# Patient Record
Sex: Female | Born: 1945 | Race: White | Hispanic: No | Marital: Married | Smoking: Never smoker
Health system: Southern US, Community
[De-identification: ages and names within clinical notes are randomized; demographics above are authoritative.]

## PROBLEM LIST (undated history)

## (undated) DIAGNOSIS — K5792 Diverticulitis of intestine, part unspecified, without perforation or abscess without bleeding: Secondary | ICD-10-CM

## (undated) DIAGNOSIS — K219 Gastro-esophageal reflux disease without esophagitis: Secondary | ICD-10-CM

## (undated) HISTORY — PX: APPENDECTOMY: SHX54

## (undated) HISTORY — PX: TONSILLECTOMY: SUR1361

---

## 1975-02-11 HISTORY — PX: TUBAL LIGATION: SHX77

## 2013-06-16 DIAGNOSIS — K219 Gastro-esophageal reflux disease without esophagitis: Secondary | ICD-10-CM | POA: Insufficient documentation

## 2013-06-16 DIAGNOSIS — K5792 Diverticulitis of intestine, part unspecified, without perforation or abscess without bleeding: Secondary | ICD-10-CM | POA: Insufficient documentation

## 2015-01-13 ENCOUNTER — Encounter: Payer: Self-pay | Admitting: Emergency Medicine

## 2015-01-13 ENCOUNTER — Emergency Department: Payer: BC Managed Care – PPO

## 2015-01-13 DIAGNOSIS — R0602 Shortness of breath: Secondary | ICD-10-CM | POA: Insufficient documentation

## 2015-01-13 DIAGNOSIS — K219 Gastro-esophageal reflux disease without esophagitis: Secondary | ICD-10-CM | POA: Insufficient documentation

## 2015-01-13 DIAGNOSIS — Z7982 Long term (current) use of aspirin: Secondary | ICD-10-CM | POA: Insufficient documentation

## 2015-01-13 DIAGNOSIS — K8000 Calculus of gallbladder with acute cholecystitis without obstruction: Secondary | ICD-10-CM | POA: Diagnosis present

## 2015-01-13 DIAGNOSIS — R1013 Epigastric pain: Secondary | ICD-10-CM | POA: Diagnosis present

## 2015-01-13 DIAGNOSIS — Z9851 Tubal ligation status: Secondary | ICD-10-CM | POA: Diagnosis not present

## 2015-01-13 DIAGNOSIS — Z79899 Other long term (current) drug therapy: Secondary | ICD-10-CM | POA: Insufficient documentation

## 2015-01-13 DIAGNOSIS — R072 Precordial pain: Secondary | ICD-10-CM | POA: Insufficient documentation

## 2015-01-13 DIAGNOSIS — K8010 Calculus of gallbladder with chronic cholecystitis without obstruction: Principal | ICD-10-CM | POA: Insufficient documentation

## 2015-01-13 DIAGNOSIS — D72829 Elevated white blood cell count, unspecified: Secondary | ICD-10-CM | POA: Insufficient documentation

## 2015-01-13 DIAGNOSIS — R52 Pain, unspecified: Secondary | ICD-10-CM | POA: Diagnosis present

## 2015-01-13 DIAGNOSIS — K449 Diaphragmatic hernia without obstruction or gangrene: Secondary | ICD-10-CM | POA: Insufficient documentation

## 2015-01-13 LAB — CBC WITH DIFFERENTIAL/PLATELET
BASOS PCT: 1 %
Basophils Absolute: 0.1 10*3/uL (ref 0–0.1)
EOS ABS: 0.3 10*3/uL (ref 0–0.7)
EOS PCT: 3 %
HCT: 38.2 % (ref 35.0–47.0)
Hemoglobin: 12.3 g/dL (ref 12.0–16.0)
LYMPHS ABS: 2.3 10*3/uL (ref 1.0–3.6)
Lymphocytes Relative: 20 %
MCH: 26.4 pg (ref 26.0–34.0)
MCHC: 32.3 g/dL (ref 32.0–36.0)
MCV: 81.9 fL (ref 80.0–100.0)
MONO ABS: 0.6 10*3/uL (ref 0.2–0.9)
MONOS PCT: 5 %
Neutro Abs: 8.5 10*3/uL — ABNORMAL HIGH (ref 1.4–6.5)
Neutrophils Relative %: 71 %
Platelets: 281 10*3/uL (ref 150–440)
RBC: 4.67 MIL/uL (ref 3.80–5.20)
RDW: 14 % (ref 11.5–14.5)
WBC: 11.9 10*3/uL — ABNORMAL HIGH (ref 3.6–11.0)

## 2015-01-13 LAB — COMPREHENSIVE METABOLIC PANEL
ALBUMIN: 4.2 g/dL (ref 3.5–5.0)
ALT: 14 U/L (ref 14–54)
ANION GAP: 8 (ref 5–15)
AST: 19 U/L (ref 15–41)
Alkaline Phosphatase: 97 U/L (ref 38–126)
BILIRUBIN TOTAL: 0.4 mg/dL (ref 0.3–1.2)
BUN: 17 mg/dL (ref 6–20)
CALCIUM: 9.6 mg/dL (ref 8.9–10.3)
CO2: 23 mmol/L (ref 22–32)
CREATININE: 0.86 mg/dL (ref 0.44–1.00)
Chloride: 108 mmol/L (ref 101–111)
GFR calc non Af Amer: >60 mL/min (ref >60)
GLUCOSE: 165 mg/dL — AB (ref 65–99)
Potassium: 3.7 mmol/L (ref 3.5–5.1)
SODIUM: 139 mmol/L (ref 135–145)
TOTAL PROTEIN: 7.9 g/dL (ref 6.5–8.1)

## 2015-01-13 LAB — LIPASE, BLOOD: Lipase: 25 U/L (ref 11–51)

## 2015-01-13 MED ORDER — GI COCKTAIL ~~LOC~~
30.0000 mL | Freq: Once | ORAL | Status: AC
  Administered 2015-01-13: 30 mL via ORAL
  Filled 2015-01-13: qty 30

## 2015-01-13 MED ORDER — SODIUM CHLORIDE 0.9 % IV SOLN
1000.0000 mL | Freq: Once | INTRAVENOUS | Status: AC
  Administered 2015-01-13: 1000 mL via INTRAVENOUS

## 2015-01-13 MED ORDER — MORPHINE SULFATE (PF) 4 MG/ML IV SOLN
4.0000 mg | Freq: Once | INTRAVENOUS | Status: AC
  Administered 2015-01-13: 4 mg via INTRAVENOUS
  Filled 2015-01-13: qty 1

## 2015-01-13 MED ORDER — ONDANSETRON HCL 4 MG/2ML IJ SOLN
4.0000 mg | Freq: Once | INTRAMUSCULAR | Status: AC
Start: 2015-01-13 — End: 2015-01-13
  Administered 2015-01-13: 4 mg via INTRAVENOUS
  Filled 2015-01-13: qty 2

## 2015-01-13 NOTE — ED Notes (Signed)
Family member to stat registration stating pt only got minimal relief from GI cocktail and now she's nauseaous; IV to be placed with protocol orders for pain and nausea medication

## 2015-01-13 NOTE — ED Notes (Signed)
Pt c/o epigastric pain that radiates straight through to her back; denies nausea or vomiting; denies diaphoresis; denies shortness of breath; pt says she was getting ready for bed when the pain started; says she was at a family reunion today and ate a lot of different foods;

## 2015-01-14 ENCOUNTER — Emergency Department: Payer: BC Managed Care – PPO

## 2015-01-14 ENCOUNTER — Observation Stay
Admission: EM | Admit: 2015-01-14 | Discharge: 2015-01-16 | Disposition: A | Discharge status: Home / Self Care | Payer: BC Managed Care – PPO | Attending: Surgery | Admitting: Surgery

## 2015-01-14 DIAGNOSIS — R52 Pain, unspecified: Secondary | ICD-10-CM

## 2015-01-14 DIAGNOSIS — K8010 Calculus of gallbladder with chronic cholecystitis without obstruction: Secondary | ICD-10-CM | POA: Diagnosis present

## 2015-01-14 DIAGNOSIS — R1013 Epigastric pain: Secondary | ICD-10-CM

## 2015-01-14 DIAGNOSIS — K8000 Calculus of gallbladder with acute cholecystitis without obstruction: Secondary | ICD-10-CM

## 2015-01-14 HISTORY — DX: Gastro-esophageal reflux disease without esophagitis: K21.9

## 2015-01-14 HISTORY — DX: Diverticulitis of intestine, part unspecified, without perforation or abscess without bleeding: K57.92

## 2015-01-14 LAB — CBC
HEMATOCRIT: 35.4 % (ref 35.0–47.0)
HEMOGLOBIN: 11.7 g/dL — AB (ref 12.0–16.0)
MCH: 27.3 pg (ref 26.0–34.0)
MCHC: 33 g/dL (ref 32.0–36.0)
MCV: 82.8 fL (ref 80.0–100.0)
Platelets: 237 10*3/uL (ref 150–440)
RBC: 4.27 MIL/uL (ref 3.80–5.20)
RDW: 13.7 % (ref 11.5–14.5)
WBC: 9 10*3/uL (ref 3.6–11.0)

## 2015-01-14 LAB — URINALYSIS COMPLETE WITH MICROSCOPIC (ARMC ONLY)
BILIRUBIN URINE: NEGATIVE
Bacteria, UA: NONE SEEN
Glucose, UA: 50 mg/dL — AB
Ketones, ur: NEGATIVE mg/dL
Leukocytes, UA: NEGATIVE
Nitrite: NEGATIVE
PH: 7 (ref 5.0–8.0)
Protein, ur: NEGATIVE mg/dL
Specific Gravity, Urine: 1.012 (ref 1.005–1.030)

## 2015-01-14 LAB — CREATININE, SERUM
CREATININE: 0.79 mg/dL (ref 0.44–1.00)
GFR calc Af Amer: >60 mL/min (ref >60)

## 2015-01-14 LAB — TROPONIN I

## 2015-01-14 MED ORDER — IOHEXOL 300 MG/ML  SOLN
100.0000 mL | Freq: Once | INTRAMUSCULAR | Status: AC | PRN
  Administered 2015-01-14: 100 mL via INTRAVENOUS

## 2015-01-14 MED ORDER — PIPERACILLIN-TAZOBACTAM 3.375 G IVPB
INTRAVENOUS | Status: AC
  Filled 2015-01-14: qty 50

## 2015-01-14 MED ORDER — ONDANSETRON 4 MG PO TBDP: 4.0000 mg | ORAL_TABLET | Freq: Four times a day (QID) | ORAL | Status: DC | PRN

## 2015-01-14 MED ORDER — ACETAMINOPHEN 325 MG PO TABS: 650.0000 mg | ORAL_TABLET | Freq: Four times a day (QID) | ORAL | Status: DC | PRN

## 2015-01-14 MED ORDER — KCL IN DEXTROSE-NACL 20-5-0.45 MEQ/L-%-% IV SOLN
INTRAVENOUS | Status: DC
  Administered 2015-01-14 – 2015-01-15 (×4): via INTRAVENOUS
  Filled 2015-01-14 (×7): qty 1000

## 2015-01-14 MED ORDER — SODIUM CHLORIDE 0.9 % IV BOLUS (SEPSIS): 500.0000 mL | Freq: Once | INTRAVENOUS | Status: DC

## 2015-01-14 MED ORDER — ACETAMINOPHEN 650 MG RE SUPP: 650.0000 mg | Freq: Four times a day (QID) | RECTAL | Status: DC | PRN

## 2015-01-14 MED ORDER — PIPERACILLIN-TAZOBACTAM 3.375 G IVPB
3.3750 g | Freq: Three times a day (TID) | INTRAVENOUS | Status: DC
  Administered 2015-01-14 – 2015-01-16 (×7): 3.375 g via INTRAVENOUS
  Filled 2015-01-14 (×9): qty 50

## 2015-01-14 MED ORDER — HYDROCODONE-ACETAMINOPHEN 5-325 MG PO TABS
1.0000 | ORAL_TABLET | ORAL | Status: DC | PRN
  Administered 2015-01-16: 1 via ORAL
  Filled 2015-01-14: qty 1

## 2015-01-14 MED ORDER — MORPHINE SULFATE (PF) 4 MG/ML IV SOLN: 4.0000 mg | INTRAVENOUS | Status: DC | PRN

## 2015-01-14 MED ORDER — ONDANSETRON HCL 4 MG/2ML IJ SOLN: 4.0000 mg | Freq: Four times a day (QID) | INTRAMUSCULAR | Status: DC | PRN

## 2015-01-14 MED ORDER — ENOXAPARIN SODIUM 40 MG/0.4ML ~~LOC~~ SOLN
40.0000 mg | SUBCUTANEOUS | Status: DC
  Administered 2015-01-14 – 2015-01-15 (×2): 40 mg via SUBCUTANEOUS
  Filled 2015-01-14 (×2): qty 0.4

## 2015-01-14 MED ORDER — PANTOPRAZOLE SODIUM 40 MG IV SOLR
40.0000 mg | Freq: Every day | INTRAVENOUS | Status: DC
  Administered 2015-01-14 – 2015-01-15 (×2): 40 mg via INTRAVENOUS
  Filled 2015-01-14 (×2): qty 40

## 2015-01-14 NOTE — ED Notes (Signed)
Pt reports coming to the ER from home after developing midsternal pain that radiate to her back. Does not radiate anywhere else. Pt reports the pain started around 1800 yesterday 01/13/15. Pt currently rates pain 2/10 at this time.  Pt does report that she has had this pain before but never went to the dr or ER, she just waited for it to subside.  Pt verbalizes in full sentences, no other medical needs verbalized at this time.

## 2015-01-14 NOTE — ED Notes (Signed)
Patient transported to CT 

## 2015-01-14 NOTE — H&P (Signed)
Bethany Davenport is a 69 y.o. female  was approximately 12 history of abdominal pain.  HPI: She was in her usual state of good health until last evening when she developed some significant midepigastric pain. She has had multiple previous similar symptoms in the past but is never been treated for them. She takes Pepcid regularly for these symptoms. They have never progressed to the point that this current episode.  She denies any nausea or vomiting. She had gone to a family holiday meal yesterday afternoon. She has been having some constipation but no bloating or indigestion fever or chills. She denies any history of pancreatitis yellow jaundice hepatitis peptic ulcer disease previously diagnosed gallbladder disease. She does have a history of multiple episodes of diverticulitis treated as an outpatient. She does not have regular family physician but has been evaluated at one of the outpatient centers [facility]. Workup in the emergency room demonstrated normal laboratory values with exception of slightly elevated white blood cell count. CT scan for possible aortic dissection demonstrated gallbladder wall thickening and enhancement with what appeared to be an impacted gallstone. Liver function studies were unremarkable. The surgical service was consulted.  Past Medical History  Diagnosis Date  . Diverticulitis   . GERD (gastroesophageal reflux disease)    Past Surgical History  Procedure Laterality Date  . Appendectomy    . Tubal ligation    . Tonsillectomy     Social History   Social History  . Marital Status: Married    Spouse Name: N/A  . Number of Children: N/A  . Years of Education: N/A   Social History Main Topics  . Smoking status: Never Smoker   . Smokeless tobacco: Never Used  . Alcohol Use: No  . Drug Use: No  . Sexual Activity: Not Asked   Other Topics Concern  . None   Social History Narrative     Review of Systems  Constitutional: Negative for fever and  chills.  HENT: Negative.   Eyes: Negative.   Respiratory: Negative for cough, shortness of breath and wheezing.   Cardiovascular: Negative for chest pain and palpitations.  Gastrointestinal: Positive for heartburn, abdominal pain and constipation. Negative for nausea and vomiting.  Genitourinary: Negative.   Musculoskeletal: Negative.   Skin: Negative.   Neurological: Negative.   Psychiatric/Behavioral: Negative.      PHYSICAL EXAM: BP 125/57 mmHg  Pulse 69  Temp(Src) 97.8 F (36.6 C) (Oral)  Resp 16  Ht  (1.499 m)  Wt 78.926 kg (174 lb)  BMI 35.13 kg/m2  SpO2 98%  Physical Exam  Constitutional: She is oriented to person, place, and time. She appears well-developed and well-nourished. No distress.  HENT:  Head: Normocephalic and atraumatic.  Eyes: EOM are normal. Pupils are equal, round, and reactive to light.  Neck: Normal range of motion. Neck supple.  Cardiovascular: Normal rate and regular rhythm.   Pulmonary/Chest: Effort normal and breath sounds normal.  Abdominal: Soft. Bowel sounds are normal. She exhibits no distension. There is no tenderness.  Musculoskeletal: Normal range of motion. She exhibits no edema.  Neurological: She is alert and oriented to person, place, and time.  Skin: Skin is warm and dry. She is not diaphoretic.  Psychiatric: She has a normal mood and affect. Her behavior is normal.   Currently her abdomen is benign. She denies any abdominal tenderness. She does not have any point tenderness rebound or guarding on her examination.  Impression/Plan: I have independently reviewed her CT scan. She does  appear to have significant gallbladder wall thickening. Does appear to be a large gallstone in the neck of the gallbladder. There does not appear to be any other significant findings in her chest or upper abdomen.  I reviewed her CT scan report from May 2015 when she was evaluated for diverticulitis. She did have CT evidence of gallbladder disease at  that time without evidence for acute cholecystitis.  We will admit her to the hospital at her request for pain control start her on IV antibiotics and fluid rehydration and make a decision regarding surgical intervention. Risks benefits and options have been outlined to the patient in detail. She is in agreement.   Tiney Rougealph Ely III, MD  01/14/2015, 5:20 AM

## 2015-01-14 NOTE — ED Provider Notes (Signed)
[facility] Emergency Department Provider Note  ____________________________________________  Time seen: Approximately 2:12 AM  I have reviewed the triage vital signs and the nursing notes.   HISTORY  Chief Complaint Abdominal Pain and Back Pain    HPI Bethany Davenport is a 69 y.o. female with history of GERD and diverticulitis who presents for evaluation of gradual onset of epigastric abdominal pain radiating to the back which began at approximately 8 PM yesterday evening. Patient reports that she ate dinner at approximately 7 PM and by 8 PM she was experiencing discomfort. It was not worse with exertion, not associated with any shortness of breath, she was a bit nauseated but did not vomit. He has had this pain in the past and usually resolves with "walking around" however tonight the pain did not resolve. Pain is been constant since onset but has improved significantly with time, there are no modifying factors, current severity of pain is mild. No diarrhea, fevers, chills or other recent illness.   Past Medical History  Diagnosis Date  . Diverticulitis   . GERD (gastroesophageal reflux disease)     Patient Active Problem List   Diagnosis Date Noted  . Cholecystitis with cholelithiasis 01/14/2015    Past Surgical History  Procedure Laterality Date  . Appendectomy    . Tubal ligation    . Tonsillectomy      Current Outpatient Rx  Name  Route  Sig  Dispense  Refill  . aspirin 325 MG tablet   Oral   Take 325 mg by mouth every 6 (six) hours as needed.         . calcium carbonate (TUMS - DOSED IN MG ELEMENTAL CALCIUM) 500 MG chewable tablet   Oral   Chew 1 tablet by mouth 4 (four) times daily as needed for indigestion or heartburn.         . famotidine (PEPCID AC) 10 MG chewable tablet   Oral   Chew 20 mg by mouth 2 (two) times daily as needed for heartburn.           Allergies Sulfa antibiotics  History reviewed. No pertinent family  history.  Social History Social History  Substance Use Topics  . Smoking status: Never Smoker   . Smokeless tobacco: Never Used  . Alcohol Use: No    Review of Systems Constitutional: No fever/chills Eyes: No visual changes. ENT: No sore throat. Cardiovascular: Denies chest pain. Respiratory: Denies shortness of breath. Gastrointestinal: + epigastric abdominal pain.  + nausea, no vomiting.  No diarrhea.  No constipation. Genitourinary: Negative for dysuria. Musculoskeletal: Negative for back pain. Skin: Negative for rash. Neurological: Negative for headaches, focal weakness or numbness.  10-point ROS otherwise negative.  ____________________________________________   PHYSICAL EXAM:  VITAL SIGNS: ED Triage Vitals  Enc Vitals Group     BP 01/13/15 2227 188/100 mmHg     Pulse Rate 01/13/15 2227 88     Resp 01/13/15 2227 20     Temp 01/13/15 2227 97.8 F (36.6 C)     Temp Source 01/13/15 2227 Oral     SpO2 01/13/15 2227 99 %     Weight 01/13/15 2227 174 lb (78.926 kg)     Height 01/13/15 2227  (1.499 m)     Head Cir --      Peak Flow --      Pain Score 01/13/15 2229 7     Pain Loc --      Pain Edu? --  Excl. in GC? --     Constitutional: Alert and oriented. Well appearing and in no acute distress. Eyes: Conjunctivae are normal. PERRL. EOMI. Head: Atraumatic. Nose: No congestion/rhinnorhea. Mouth/Throat: Mucous membranes are moist.  Oropharynx non-erythematous. Neck: No stridor.   Cardiovascular: Normal rate, regular rhythm. Grossly normal heart sounds.  Good peripheral circulation. Respiratory: Normal respiratory effort.  No retractions. Lungs CTAB. Gastrointestinal: Soft and nontender. No distention.  No CVA tenderness. Genitourinary: deferred Musculoskeletal: No lower extremity tenderness nor edema.  No joint effusions. Neurologic:  Normal speech and language. No gross focal neurologic deficits are appreciated.  Skin:  Skin is warm, dry and intact.  No rash noted. Psychiatric: Mood and affect are normal. Speech and behavior are normal.  ____________________________________________   LABS (all labs ordered are listed, but only abnormal results are displayed)  Labs Reviewed  CBC WITH DIFFERENTIAL/PLATELET - Abnormal; Notable for the following:    WBC 11.9 (*)    Neutro Abs 8.5 (*)    All other components within normal limits  COMPREHENSIVE METABOLIC PANEL - Abnormal; Notable for the following:    Glucose, Bld 165 (*)    All other components within normal limits  URINALYSIS COMPLETEWITH MICROSCOPIC ([facility] ONLY) - Abnormal; Notable for the following:    Color, Urine STRAW (*)    APPearance CLEAR (*)    Glucose, UA 50 (*)    Hgb urine dipstick 1+ (*)    Squamous Epithelial / LPF 0-5 (*)    All other components within normal limits  LIPASE, BLOOD  TROPONIN I  CBC  CREATININE, SERUM   ____________________________________________  EKG  ED ECG REPORT I, Gayla DossGayle, Narek Kniss A, the attending physician, personally viewed and interpreted this ECG.   Date: 01/14/2015  EKG Time: 22:20  Rate: 88  Rhythm: normal sinus rhythm  Axis: normal  Intervals:none  ST&T Change: No acute ST elevation.   ____________________________________________  RADIOLOGY  CXR IMPRESSION: No active cardiopulmonary disease.   CT angio chest IMPRESSION: 1. Thoracic aorta and upper abdominal aorta is normal in caliber with no evidence of dissection or other acute vascular event. Pulmonary arterial vasculature is also widely patent. 2. No acute findings are evident in the chest. 3. Small hiatal hernia. 4. Cholelithiasis. Gallbladder wall thickening and edema. Cannot exclude cholecystitis.   ____________________________________________   PROCEDURES  Procedure(s) performed: None  Critical Care performed: No  ____________________________________________   INITIAL IMPRESSION / ASSESSMENT AND PLAN / ED COURSE  Pertinent labs & imaging results  that were available during my care of the patient were reviewed by me and considered in my medical decision making (see chart for details).  Carolyne FiscalBrenda Markov is a 69 y.o. female with history of GERD and diverticulitis who presents for evaluation of gradual onset of epigastric abdominal pain radiating to the back which began at approximately 8 PM yesterday evening. Currently, she reports she feels much better. On exam, she is generally well-appearing and in no acute distress. Vital signs stable, she is afebrile and she has benign examination. Symptoms may be GI related however given advancing age, we'll obtain cardiac enzymes, EKG, CT of the abdomen and pelvis. Reassess for disposition.  ----------------------------------------- 4:03 AM on 01/14/2015 ----------------------------------------- Troponin less than 0.03 was drawn greater than 6 hours after onset of pain. As it is negative and pain has been constant since onset, I doubt ACS. EKG reassuring. Labs notable for mild leukocytosis. Normal CMP, normal lipase. CT NGO chest was obtained to rule out acute aortic dissection. There is no evidence of  dissection or PE however cholelithiasis with gallbladder wall thickening and edema is noted concerning for acute cholecystitis. Case discussed with Dr. Michela Pitcher of general surgery who will evaluate the patient and admit.  ____________________________________________   FINAL CLINICAL IMPRESSION(S) / ED DIAGNOSES  Final diagnoses:  Pain  Epigastric abdominal pain  Calculus of gallbladder with acute cholecystitis without obstruction      Gayla Doss, MD 01/14/15 [phone]

## 2015-01-15 ENCOUNTER — Encounter: Payer: Self-pay | Admitting: *Deleted

## 2015-01-15 ENCOUNTER — Observation Stay: Payer: BC Managed Care – PPO | Admitting: Anesthesiology

## 2015-01-15 ENCOUNTER — Encounter
Admission: EM | Disposition: A | Discharge status: Home / Self Care | Payer: Self-pay | Source: Home / Self Care | Attending: Student

## 2015-01-15 DIAGNOSIS — K8010 Calculus of gallbladder with chronic cholecystitis without obstruction: Secondary | ICD-10-CM

## 2015-01-15 DIAGNOSIS — K8000 Calculus of gallbladder with acute cholecystitis without obstruction: Secondary | ICD-10-CM | POA: Insufficient documentation

## 2015-01-15 HISTORY — PX: CHOLECYSTECTOMY: SHX55

## 2015-01-15 LAB — MRSA PCR SCREENING: MRSA by PCR: NEGATIVE

## 2015-01-15 SURGERY — LAPAROSCOPIC CHOLECYSTECTOMY
Anesthesia: General

## 2015-01-15 MED ORDER — ROCURONIUM BROMIDE 100 MG/10ML IV SOLN
INTRAVENOUS | Status: DC | PRN
  Administered 2015-01-15: 30 mg via INTRAVENOUS
  Administered 2015-01-15: 10 mg via INTRAVENOUS

## 2015-01-15 MED ORDER — LIDOCAINE HCL (CARDIAC) 20 MG/ML IV SOLN
INTRAVENOUS | Status: DC | PRN
  Administered 2015-01-15: 30 mg via INTRAVENOUS

## 2015-01-15 MED ORDER — LACTATED RINGERS IV SOLN
INTRAVENOUS | Status: DC | PRN
  Administered 2015-01-15: 10:00:00 via INTRAVENOUS

## 2015-01-15 MED ORDER — OXYCODONE HCL 5 MG PO TABS: 5.0000 mg | ORAL_TABLET | Freq: Once | ORAL | Status: DC | PRN

## 2015-01-15 MED ORDER — MENTHOL 3 MG MT LOZG
1.0000 | LOZENGE | OROMUCOSAL | Status: DC | PRN
  Administered 2015-01-15 (×3): 3 mg via ORAL
  Filled 2015-01-15: qty 9

## 2015-01-15 MED ORDER — MIDAZOLAM HCL 2 MG/2ML IJ SOLN
INTRAMUSCULAR | Status: DC | PRN
  Administered 2015-01-15: 2 mg via INTRAVENOUS

## 2015-01-15 MED ORDER — GLYCOPYRROLATE 0.2 MG/ML IJ SOLN
INTRAMUSCULAR | Status: DC | PRN
  Administered 2015-01-15: 0.6 mg via INTRAVENOUS

## 2015-01-15 MED ORDER — NEOSTIGMINE METHYLSULFATE 10 MG/10ML IV SOLN
INTRAVENOUS | Status: DC | PRN
  Administered 2015-01-15: 5 mg via INTRAVENOUS

## 2015-01-15 MED ORDER — KETOROLAC TROMETHAMINE 30 MG/ML IJ SOLN
INTRAMUSCULAR | Status: DC | PRN
  Administered 2015-01-15: 30 mg via INTRAVENOUS

## 2015-01-15 MED ORDER — PROPOFOL 10 MG/ML IV BOLUS
INTRAVENOUS | Status: DC | PRN
  Administered 2015-01-15: 100 mg via INTRAVENOUS

## 2015-01-15 MED ORDER — BUPIVACAINE-EPINEPHRINE (PF) 0.25% -1:200000 IJ SOLN
INTRAMUSCULAR | Status: DC | PRN
  Administered 2015-01-15: 30 mL

## 2015-01-15 MED ORDER — FENTANYL CITRATE (PF) 100 MCG/2ML IJ SOLN: 25.0000 ug | INTRAMUSCULAR | Status: DC | PRN

## 2015-01-15 MED ORDER — FENTANYL CITRATE (PF) 100 MCG/2ML IJ SOLN
INTRAMUSCULAR | Status: DC | PRN
  Administered 2015-01-15 (×2): 100 ug via INTRAVENOUS

## 2015-01-15 MED ORDER — ONDANSETRON HCL 4 MG/2ML IJ SOLN
INTRAMUSCULAR | Status: DC | PRN
Start: 2015-01-15 — End: 2015-01-15
  Administered 2015-01-15: 4 mg via INTRAVENOUS

## 2015-01-15 MED ORDER — OXYCODONE HCL 5 MG/5ML PO SOLN: 5.0000 mg | Freq: Once | ORAL | Status: DC | PRN

## 2015-01-15 MED ORDER — NALOXONE HCL 0.4 MG/ML IJ SOLN
INTRAMUSCULAR | Status: DC | PRN
  Administered 2015-01-15: 80 ug via INTRAVENOUS

## 2015-01-15 SURGICAL SUPPLY — 41 items
ADHESIVE MASTISOL STRL (MISCELLANEOUS) ×2 IMPLANT
APPLIER CLIP ROT 10 11.4 M/L (STAPLE) ×2
BLADE SURG SZ11 CARB STEEL (BLADE) ×2 IMPLANT
CANISTER SUCT 1200ML W/VALVE (MISCELLANEOUS) ×2 IMPLANT
CATH CHOLANGI 4FR 420404F (CATHETERS) ×2 IMPLANT
CHLORAPREP W/TINT 26ML (MISCELLANEOUS) ×2 IMPLANT
CLIP APPLIE ROT 10 11.4 M/L (STAPLE) ×1 IMPLANT
CONRAY 60ML FOR OR (MISCELLANEOUS) ×2 IMPLANT
DRAPE C-ARM XRAY 36X54 (DRAPES) ×2 IMPLANT
ENDOPOUCH RETRIEVER 10 (MISCELLANEOUS) ×2 IMPLANT
GAUZE SPONGE NON-WVN 2X2 STRL (MISCELLANEOUS) ×4 IMPLANT
GLOVE BIO SURGEON STRL SZ8 (GLOVE) ×2 IMPLANT
GOWN STRL REUS W/ TWL LRG LVL3 (GOWN DISPOSABLE) ×4 IMPLANT
GOWN STRL REUS W/TWL LRG LVL3 (GOWN DISPOSABLE) ×4
IRRIGATION STRYKERFLOW (MISCELLANEOUS) ×1 IMPLANT
IRRIGATOR STRYKERFLOW (MISCELLANEOUS) ×2
IV CATH ANGIO 12GX3 LT BLUE (NEEDLE) ×2 IMPLANT
IV NS 1000ML (IV SOLUTION) ×1
IV NS 1000ML BAXH (IV SOLUTION) ×1 IMPLANT
JACKSON PRATT 10 (INSTRUMENTS) ×2 IMPLANT
KIT RM TURNOVER STRD PROC AR (KITS) ×2 IMPLANT
LABEL OR SOLS (LABEL) ×2 IMPLANT
NDL SAFETY 22GX1.5 (NEEDLE) ×2 IMPLANT
NEEDLE VERESS 14GA 120MM (NEEDLE) ×2 IMPLANT
NS IRRIG 500ML POUR BTL (IV SOLUTION) ×2 IMPLANT
PACK LAP CHOLECYSTECTOMY (MISCELLANEOUS) ×2 IMPLANT
PAD GROUND ADULT SPLIT (MISCELLANEOUS) ×2 IMPLANT
SCISSORS METZENBAUM CVD 33 (INSTRUMENTS) ×2 IMPLANT
SLEEVE ENDOPATH XCEL 5M (ENDOMECHANICALS) ×2 IMPLANT
SPONGE EXCIL AMD DRAIN 4X4 6P (MISCELLANEOUS) ×2 IMPLANT
SPONGE LAP 18X18 5 PK (GAUZE/BANDAGES/DRESSINGS) ×2 IMPLANT
SPONGE VERSALON 2X2 STRL (MISCELLANEOUS) ×4
STRIP CLOSURE SKIN 1/2X4 (GAUZE/BANDAGES/DRESSINGS) ×2 IMPLANT
SUT MNCRL 4-0 (SUTURE) ×1
SUT MNCRL 4-0 27XMFL (SUTURE) ×1
SUT VICRYL 0 AB UR-6 (SUTURE) ×2 IMPLANT
SUTURE MNCRL 4-0 27XMF (SUTURE) ×1 IMPLANT
SYR 20CC LL (SYRINGE) ×2 IMPLANT
TROCAR XCEL NON-BLD 11X100MML (ENDOMECHANICALS) ×2 IMPLANT
TROCAR XCEL NON-BLD 5MMX100MML (ENDOMECHANICALS) ×4 IMPLANT
TUBING INSUFFLATOR HI FLOW (MISCELLANEOUS) ×2 IMPLANT

## 2015-01-15 NOTE — Progress Notes (Signed)
Preoperative Review   Patient is met in the preoperative holding area. The history is reviewed in the chart and with the patient. I personally reviewed the options and rationale as well as the risks of this procedure that have been previously discussed with the patient. All questions asked by the patient and/or family were answered to their satisfaction.  Patient agrees to proceed with this procedure at this time.  Scotland Korver E Maikel Neisler M.D. FACS  

## 2015-01-15 NOTE — Anesthesia Procedure Notes (Signed)
Procedure Name: Intubation Date/Time: 01/15/2015 10:42 AM Performed by: Omer JackWEATHERLY, Bethany Davenport Pre-anesthesia Checklist: Patient identified, Patient being monitored, Timeout performed, Emergency Drugs available and Suction available Patient Re-evaluated:Patient Re-evaluated prior to inductionOxygen Delivery Method: Circle system utilized Preoxygenation: Pre-oxygenation with 100% oxygen Intubation Type: IV induction Ventilation: Mask ventilation without difficulty Laryngoscope Size: Mac, 3, Miller and 2 Grade View: Grade I Tube type: Oral Tube size: 7.0 mm Number of attempts: 2 Placement Confirmation: ETT inserted through vocal cords under direct vision,  positive ETCO2 and breath sounds checked- equal and bilateral Secured at: 21 cm Tube secured with: Tape Dental Injury: Teeth and Oropharynx as per pre-operative assessment

## 2015-01-15 NOTE — Anesthesia Postprocedure Evaluation (Signed)
Anesthesia Post Note  Patient: Carolyne FiscalBrenda Stapp  Procedure(s) Performed: Procedure(s) (LRB): LAPAROSCOPIC CHOLECYSTECTOMY (N/A)  Patient location during evaluation: PACU Anesthesia Type: General Level of consciousness: awake and alert Pain management: pain level controlled Vital Signs Assessment: post-procedure vital signs reviewed and stable Respiratory status: spontaneous breathing, nonlabored ventilation, respiratory function stable and patient connected to nasal cannula oxygen Cardiovascular status: blood pressure returned to baseline and stable Postop Assessment: no signs of nausea or vomiting Anesthetic complications: no    Last Vitals:  Filed Vitals:   01/15/15 1250 01/15/15 1310  BP: 136/62 134/62  Pulse: 64 62  Temp: 36.3 C 36.5 C  Resp: 19 16    Last Pain:  Filed Vitals:   01/15/15 1311  PainSc: 0-No pain                 Cleda MccreedyJoseph K Sael Furches

## 2015-01-15 NOTE — Transfer of Care (Signed)
Immediate Anesthesia Transfer of Care Note  Patient: Bethany FiscalBrenda Heinz  Procedure(s) Performed: Procedure(s): LAPAROSCOPIC CHOLECYSTECTOMY (N/A)  Patient Location: PACU  Anesthesia Type:General  Level of Consciousness: patient cooperative and lethargic  Airway & Oxygen Therapy: Patient Spontanous Breathing and Patient connected to face mask oxygen  Post-op Assessment: Report given to RN and Post -op Vital signs reviewed and stable  Post vital signs: Reviewed and stable  Last Vitals:  Filed Vitals:   01/15/15 0941 01/15/15 1139  BP: 157/81 140/55  Pulse: 86 88  Temp: 36.7 C 36.3 C  Resp: 16 18    Complications: No apparent anesthesia complications

## 2015-01-15 NOTE — Anesthesia Preprocedure Evaluation (Signed)
Anesthesia Evaluation  Patient identified by MRN, date of birth, ID band Patient awake    Reviewed: Allergy & Precautions, H&P , NPO status , Patient's Chart, lab work & pertinent test results  History of Anesthesia Complications Negative for: history of anesthetic complications  Airway Mallampati: III  TM Distance: >3 FB Neck ROM: limited    Dental no notable dental hx. (+) Teeth Intact   Pulmonary neg pulmonary ROS, neg shortness of breath,    Pulmonary exam normal breath sounds clear to auscultation       Cardiovascular Exercise Tolerance: Good (-) angina(-) Past MI and (-) DOE negative cardio ROS Normal cardiovascular exam Rhythm:regular Rate:Normal     Neuro/Psych negative neurological ROS  negative psych ROS   GI/Hepatic Neg liver ROS, GERD  Controlled,  Endo/Other  negative endocrine ROS  Renal/GU negative Renal ROS  negative genitourinary   Musculoskeletal   Abdominal   Peds  Hematology negative hematology ROS (+)   Anesthesia Other Findings Past Medical History:   Diverticulitis                                               GERD (gastroesophageal reflux disease)                      Past Surgical History:   APPENDECTOMY                                                  TUBAL LIGATION                                                TONSILLECTOMY                                                BMI    Body Mass Index   35.12 kg/m 2      Reproductive/Obstetrics negative OB ROS                             Anesthesia Physical Anesthesia Plan  ASA: III  Anesthesia Plan: General ETT   Post-op Pain Management:    Induction:   Airway Management Planned:   Additional Equipment:   Intra-op Plan:   Post-operative Plan:   Informed Consent: I have reviewed the patients History and Physical, chart, labs and discussed the procedure including the risks, benefits and  alternatives for the proposed anesthesia with the patient or authorized representative who has indicated his/her understanding and acceptance.   Dental Advisory Given  Plan Discussed with: Anesthesiologist, CRNA and Surgeon  Anesthesia Plan Comments:         Anesthesia Quick Evaluation

## 2015-01-15 NOTE — Op Note (Signed)
Laparoscopic Cholecystectomy  Pre-operative Diagnosis: Acute cholecystitis  Post-operative Diagnosis: Same  Procedure: Lap or scopic cholecystectomy  Surgeon: Adah Salvageichard E. Excell Seltzerooper, MD FACS  Anesthesia: Gen. with endotracheal tube  Assistant: Surgical tech  Procedure Details  The patient was seen again in the Holding Room. The benefits, complications, treatment options, and expected outcomes were discussed with the patient. The risks of bleeding, infection, recurrence of symptoms, failure to resolve symptoms, bile duct damage, bile duct leak, retained common bile duct stone, bowel injury, any of which could require further surgery and/or ERCP, stent, or papillotomy were reviewed with the patient. The likelihood of improving the patient's symptoms with return to their baseline status is good.  The patient and/or family concurred with the proposed plan, giving informed consent.  The patient was taken to Operating Room, identified as Bethany Davenport and the procedure verified as Laparoscopic Cholecystectomy.  A Time Out was held and the above information confirmed.  Prior to the induction of general anesthesia, antibiotic prophylaxis was administered. VTE prophylaxis was in place. General endotracheal anesthesia was then administered and tolerated well. After the induction, the abdomen was prepped with Chloraprep and draped in the sterile fashion. The patient was positioned in the supine position.  Local anesthetic  was injected into the skin near the umbilicus and an incision made. The Veress needle was placed. Pneumoperitoneum was then created with CO2 and tolerated well without any adverse changes in the patient's vital signs. A 5mm port was placed in the periumbilical position and the abdominal cavity was explored.  Two 5-mm ports were placed in the right upper quadrant and a 12 mm epigastric port was placed all under direct vision. All skin incisions  were infiltrated with a local anesthetic agent  before making the incision and placing the trocars.   The patient was positioned  in reverse Trendelenburg, tilted slightly to the patient's left.  The gallbladder was identified, the fundus grasped and retracted cephalad. Adhesions were lysed bluntly. The infundibulum was grasped and retracted laterally, exposing the peritoneum overlying the triangle of Calot. This was then divided and exposed in a blunt fashion. A critical view of the cystic duct and cystic artery was obtained.  The cystic duct was clearly identified and bluntly dissected.   Cystic lymphatics were doubly clipped and divided as was the cystic artery and then the cystic duct was doubly clipped and divided.  The gallbladder was taken from the gallbladder fossa in a retrograde fashion with the electrocautery. The gallbladder was removed and placed in an Endocatch bag. The liver bed was irrigated and inspected. Hemostasis was achieved with the electrocautery. Copious irrigation was utilized and was repeatedly aspirated until clear.  The gallbladder and Endocatch sac were then removed through the epigastric port site.   Inspection of the right upper quadrant was performed. No bleeding, bile duct injury or leak, or bowel injury was noted. Pneumoperitoneum was released.  The epigastric port site was closed with figure-of-eight 0 Vicryl sutures. 4-0 subcuticular Monocryl was used to close the skin. Steristrips and Mastisol and sterile dressings were  applied.  The patient was then extubated and brought to the recovery room in stable condition. Sponge, lap, and needle counts were correct at closure and at the conclusion of the case.   Findings: Acute Cholecystitis   Estimated Blood Loss: Minimal         Drains: None         Specimens: Gallbladder           Complications: none  Bethany Davenport E. Bethany Knack, MD, FACS

## 2015-01-16 DIAGNOSIS — K8010 Calculus of gallbladder with chronic cholecystitis without obstruction: Secondary | ICD-10-CM | POA: Diagnosis not present

## 2015-01-16 LAB — CBC WITH DIFFERENTIAL/PLATELET
Basophils Absolute: 0.1 10*3/uL (ref 0–0.1)
Basophils Relative: 1 %
Eosinophils Absolute: 0.2 10*3/uL (ref 0–0.7)
Eosinophils Relative: 3 %
HEMATOCRIT: 35.3 % (ref 35.0–47.0)
HEMOGLOBIN: 11.4 g/dL — AB (ref 12.0–16.0)
LYMPHS ABS: 2.3 10*3/uL (ref 1.0–3.6)
LYMPHS PCT: 28 %
MCH: 26.6 pg (ref 26.0–34.0)
MCHC: 32.2 g/dL (ref 32.0–36.0)
MCV: 82.5 fL (ref 80.0–100.0)
MONOS PCT: 8 %
Monocytes Absolute: 0.6 10*3/uL (ref 0.2–0.9)
NEUTROS ABS: 5 10*3/uL (ref 1.4–6.5)
NEUTROS PCT: 60 %
Platelets: 226 10*3/uL (ref 150–440)
RBC: 4.28 MIL/uL (ref 3.80–5.20)
RDW: 13.8 % (ref 11.5–14.5)
WBC: 8.2 10*3/uL (ref 3.6–11.0)

## 2015-01-16 LAB — COMPREHENSIVE METABOLIC PANEL
ALBUMIN: 3.2 g/dL — AB (ref 3.5–5.0)
ALK PHOS: 67 U/L (ref 38–126)
ALT: 19 U/L (ref 14–54)
ANION GAP: 3 — AB (ref 5–15)
AST: 27 U/L (ref 15–41)
BILIRUBIN TOTAL: 0.8 mg/dL (ref 0.3–1.2)
BUN: 9 mg/dL (ref 6–20)
CALCIUM: 8.2 mg/dL — AB (ref 8.9–10.3)
CO2: 27 mmol/L (ref 22–32)
Chloride: 107 mmol/L (ref 101–111)
Creatinine, Ser: 0.97 mg/dL (ref 0.44–1.00)
GFR calc non Af Amer: 58 mL/min — ABNORMAL LOW (ref >60)
GLUCOSE: 109 mg/dL — AB (ref 65–99)
Potassium: 4 mmol/L (ref 3.5–5.1)
Sodium: 137 mmol/L (ref 135–145)
TOTAL PROTEIN: 6.3 g/dL — AB (ref 6.5–8.1)

## 2015-01-16 LAB — SURGICAL PATHOLOGY

## 2015-01-16 MED ORDER — HYDROCODONE-ACETAMINOPHEN 5-325 MG PO TABS: 1.0000 | ORAL_TABLET | ORAL | Status: DC | PRN

## 2015-01-16 NOTE — Progress Notes (Signed)
Pt VSS; 1300 until present. Pt VSS, No complaints of pain nor nausea. Tolerating regular diet. Pt received discharge orders. Instructions were reviewed with pt with all questions answered. IV removed with dressing dry and intact. Pt discharged via wheelchair.

## 2015-01-16 NOTE — Progress Notes (Signed)
Visited patient this afternoon. She feels better and is tolerating a regular diet we discussed options of staying overnight versus going home and she prefers to go home at this point. I reviewed instructions and dressing care with her.

## 2015-01-16 NOTE — Progress Notes (Signed)
1 Day Post-Op  Subjective: Status post laparoscopic cholecystectomy for acute cholecystitis. She feels better today but has incisional pain. Wants to advance her diet.  Objective: Vital signs in last 24 hours: Temp:  [97.3 F (36.3 C)-98.5 F (36.9 C)] 98 F (36.7 C) (12/06 0532) Pulse Rate:  [59-88] 77 (12/06 0532) Resp:  [14-20] 17 (12/06 0532) BP: (111-142)/(48-76) 129/53 mmHg (12/06 0532) SpO2:  [84 %-100 %] 97 % (12/06 0532) Last BM Date: 01/14/15  Intake/Output from previous day: 12/05 0701 - 12/06 0700 In: 3141.3 [P.O.:390; I.V.:2701.3; IV Piggyback:50] Out: 1745 [Urine:1725; Blood:20] Intake/Output this shift: Total I/O In: 760 [P.O.:760] Out: -   Physical exam:  No scleral icterus no jaundice soft nontender abdomen wounds dressed and no drainage. Nontender calves  Lab Results: CBC   Recent Labs  01/14/15 0748 01/16/15 0446  WBC 9.0 8.2  HGB 11.7* 11.4*  HCT 35.4 35.3  PLT 237 226   BMET  Recent Labs  01/13/15 2246 01/14/15 0748 01/16/15 0446  NA 139  --  137  K 3.7  --  4.0  CL 108  --  107  CO2 23  --  27  GLUCOSE 165*  --  109*  BUN 17  --  9  CREATININE 0.86 0.79 0.97  CALCIUM 9.6  --  8.2*   PT/INR No results for input(s): LABPROT, INR in the last 72 hours. ABG No results for input(s): PHART, HCO3 in the last 72 hours.  Invalid input(s): PCO2, PO2  Studies/Results: No results found.  Anti-infectives: Anti-infectives    Start     Dose/Rate Route Frequency Ordered Stop   01/14/15 0614  piperacillin-tazobactam (ZOSYN) 3.375 GM/50ML IVPB  Status:  Discontinued    Comments:  BRANDEL, ELIZABETH: cabinet override      01/14/15 0614 01/14/15 0611   01/14/15 0530  piperacillin-tazobactam (ZOSYN) IVPB 3.375 g     3.375 g 12.5 mL/hr over 240 Minutes Intravenous 3 times per day 01/14/15 0518        Assessment/Plan: s/p Procedure(s): LAPAROSCOPIC CHOLECYSTECTOMY   Advance diet possibly home later today or tomorrow.  Lattie Hawichard E  Ameirah Khatoon, MD, FACS  01/16/2015

## 2015-01-16 NOTE — Discharge Summary (Signed)
Physician Discharge Summary  Patient ID: Bethany FiscalBrenda Governale MRN: [id number] DOB/AGE: 69/04/1945 69 y.o.  Admit date: 01/14/2015 Discharge date: 01/16/2015   Discharge Diagnoses:  Active Problems:   Cholecystitis with cholelithiasis   Calculus of gallbladder with acute cholecystitis without obstruction   Procedures: Laparoscopic cholecystectomy  Hospital Course: This patient with signs of acute cholecystitis who was admitted to the hospital and then ultimately taken to the operating room where laparoscopic cholecystectomy confirmed the diagnosis she made a non-complicated postoperative recovery and is discharged in stable condition to follow up in my office in 10 days she is given oral analgesics for pain.  Consults: None  Disposition: Final discharge disposition not confirmed     Medication List    TAKE these medications        aspirin 325 MG tablet  Take 325 mg by mouth every 6 (six) hours as needed.     calcium carbonate 500 MG chewable tablet  Commonly known as:  TUMS - dosed in mg elemental calcium  Chew 1 tablet by mouth 4 (four) times daily as needed for indigestion or heartburn.     famotidine 10 MG chewable tablet  Commonly known as:  PEPCID AC  Chew 20 mg by mouth 2 (two) times daily as needed for heartburn.     HYDROcodone-acetaminophen 5-325 MG tablet  Commonly known as:  NORCO/VICODIN  Take 1-2 tablets by mouth every 4 (four) hours as needed for moderate pain.           Follow-up Information    Follow up with Dionne Miloichard Shavy Beachem, MD In 10 days.   Specialty:  Surgery   Contact information:   [address] [place] [id number] [phone]       Lattie Hawichard E Coti Burd, MD, FACS

## 2015-01-16 NOTE — Discharge Instructions (Signed)
Remove dressing in 24 hours. °May shower in 24 hours. °Leave paper strips in place. °Resume all home medications. °Follow-up with Dr. Rosibel Giacobbe in 10 days. °

## 2015-01-17 ENCOUNTER — Telehealth: Payer: Self-pay

## 2015-01-17 NOTE — Telephone Encounter (Signed)
Post-discharge call made to patient at this time. No answer. Left voicemail for return phone call.  

## 2015-01-22 NOTE — Telephone Encounter (Signed)
Called patient once again at this time. No answer. Left voicemail for return phone call. 

## 2015-01-25 ENCOUNTER — Encounter: Payer: Self-pay | Admitting: Surgery

## 2015-01-26 ENCOUNTER — Ambulatory Visit (INDEPENDENT_AMBULATORY_CARE_PROVIDER_SITE_OTHER): Payer: BC Managed Care – PPO | Admitting: Surgery

## 2015-01-26 ENCOUNTER — Encounter: Payer: Self-pay | Admitting: Surgery

## 2015-01-26 VITALS — BP 182/95 | HR 88 | Temp 98.1°F | Ht 59.0 in | Wt 175.4 lb

## 2015-01-26 DIAGNOSIS — K8000 Calculus of gallbladder with acute cholecystitis without obstruction: Secondary | ICD-10-CM

## 2015-01-26 NOTE — Progress Notes (Signed)
Outpatient postop visit  01/26/2015  Bethany Davenport is an 69 y.o. female.    Procedure: Laparoscopic cholecystectomy for acute cholecystitis  CC: Feels well  HPI: Status post laparoscopic cholecystectomy for acute cholecystitis. Feels well no nausea or vomiting tolerating a diet reverse her chills  Medications reviewed.    Physical Exam:  BP 182/95 mmHg  Pulse 88  Temp(Src) 98.1 F (36.7 C) (Oral)  Ht 4\' 11"  (1.499 m)  Wt 175 lb 6.4 oz (79.561 kg)  BMI 35.41 kg/m2    PE: Soft nontender abdomen wounds healing well no erythema or drainage    Assessment/Plan:  Doing very well recommend follow up on an as-needed basis.  Lattie Hawichard E Cooper, MD, FACS

## 2015-01-26 NOTE — Patient Instructions (Signed)
No heavy lifting for 4 weeks following surgery.  Please call with questions or concerns.

## 2017-06-16 IMAGING — CT CT ANGIO CHEST
1 of 2 series · 19 of 32 positions shown · IV contrast (APPLIED)
Comparison: None.

CLINICAL DATA: Midsternal chest pain radiating to the back, onset
around [DATE].

EXAM:
CT ANGIOGRAPHY CHEST WITH CONTRAST
TECHNIQUE: Multidetector CT imaging of the chest was performed using the
standard protocol during bolus administration of intravenous
contrast. Multiplanar CT image reconstructions and MIPs were
obtained to evaluate the vascular anatomy.
CONTRAST:  100mL OMNIPAQUE IOHEXOL 300 MG/ML  SOLN

[Series 6: cta chest · axial · 0.68mm/px · z∈[-348,-70]mm · 19 of 153 slices shown]
[im 7/153  lung]
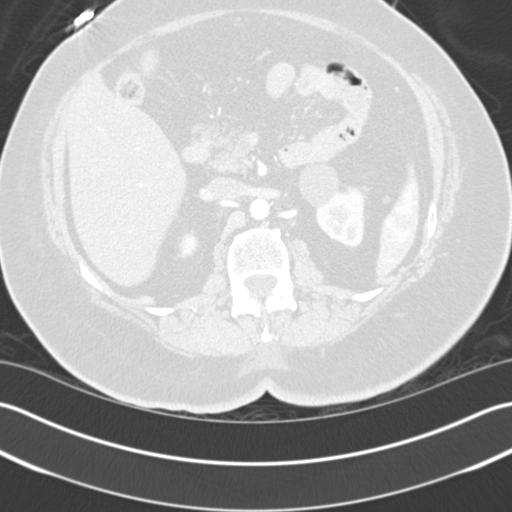
[im 14/153  soft-tissue]
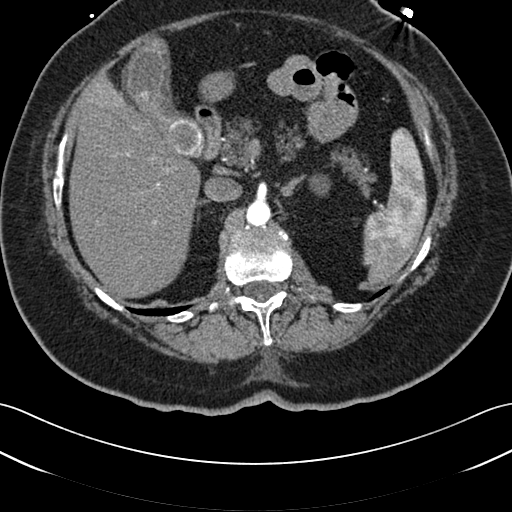
[im 21/153  lung]
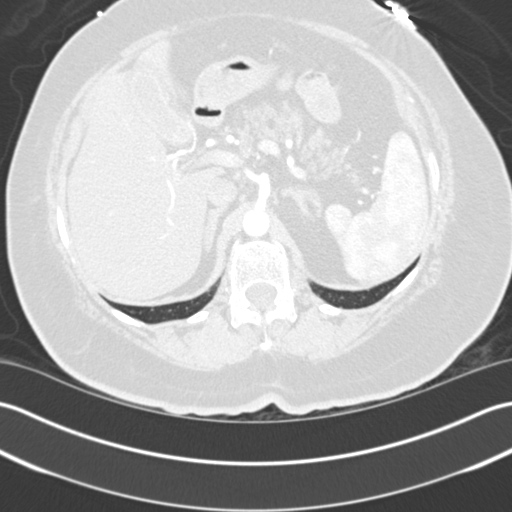
[im 28/153  soft-tissue]
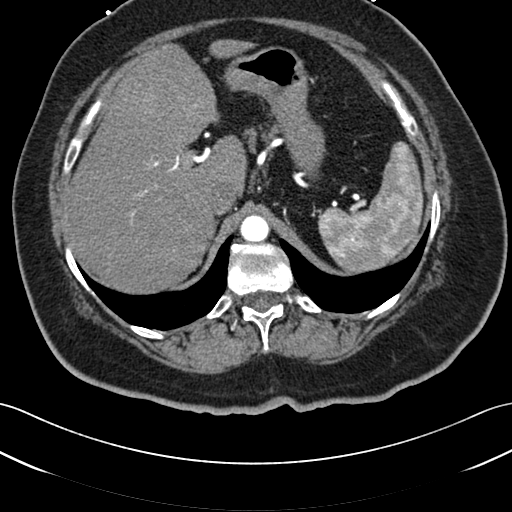
[im 35/153  lung]
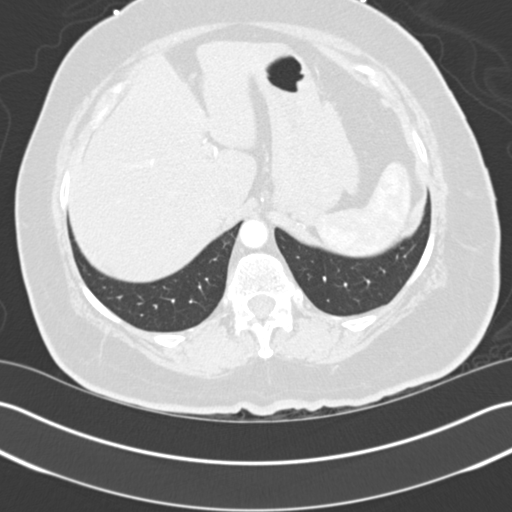
[im 49/153  soft-tissue]
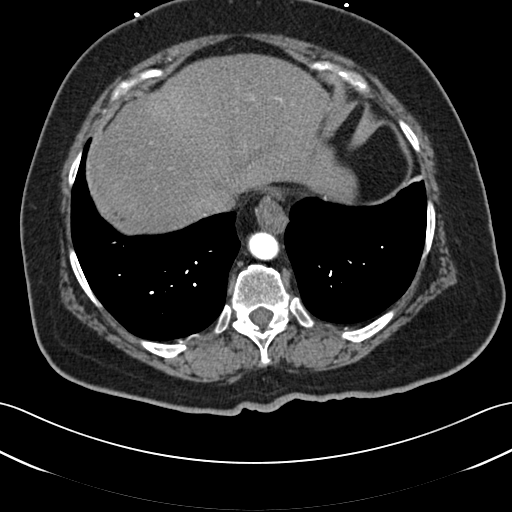
[im 56/153  lung]
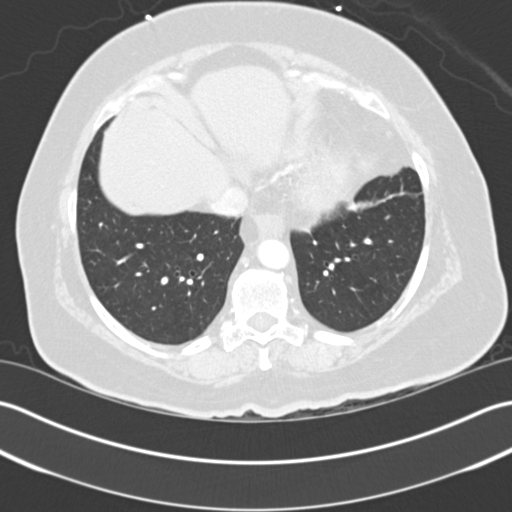
[im 63/153  soft-tissue]
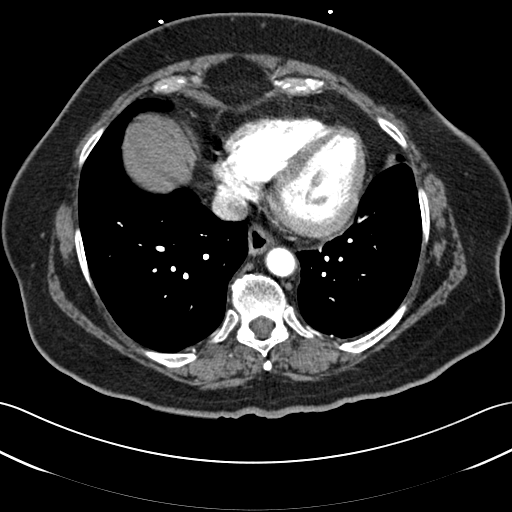
[im 70/153  lung]
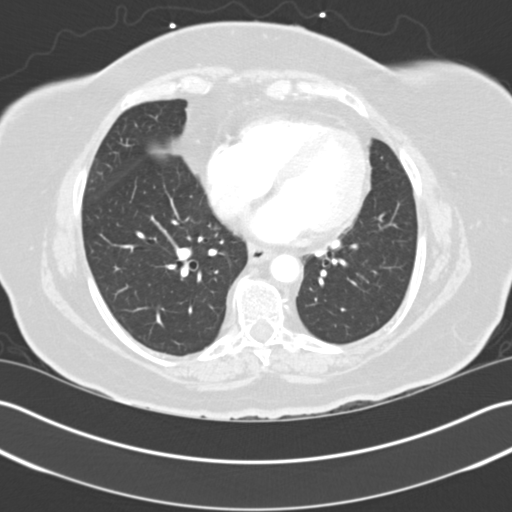
[im 77/153  soft-tissue]
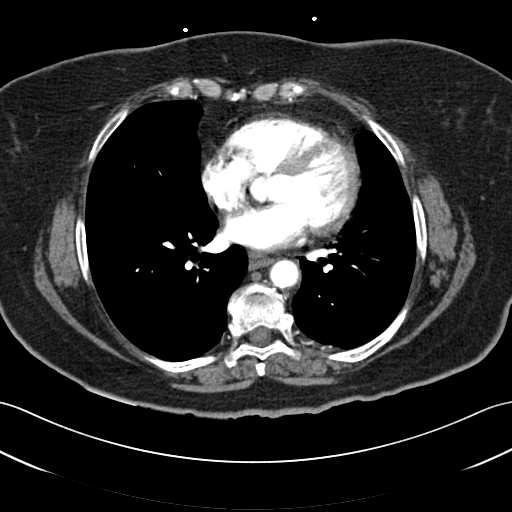
[im 83/153  lung]
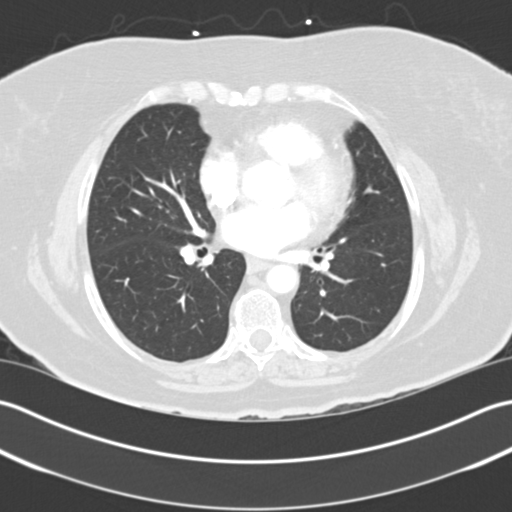
[im 90/153  soft-tissue]
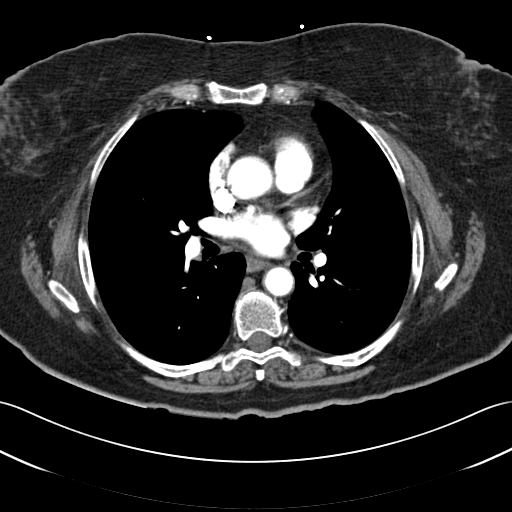
[im 97/153  lung]
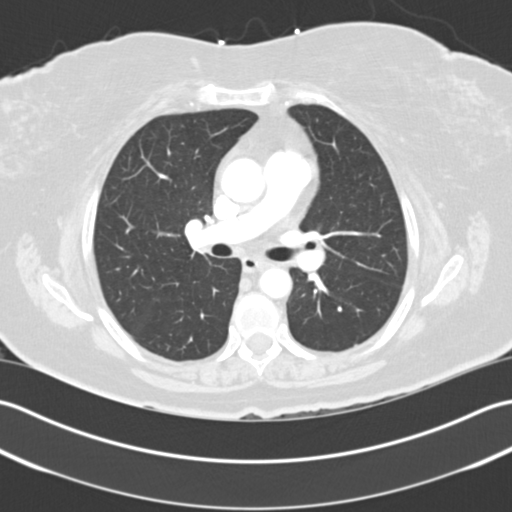
[im 104/153  soft-tissue]
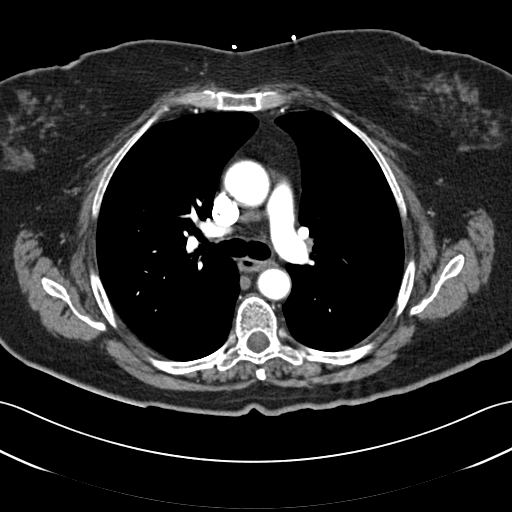
[im 118/153  lung]
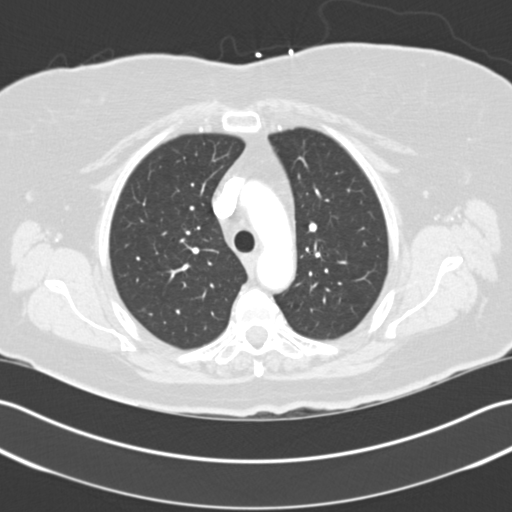
[im 125/153  soft-tissue]
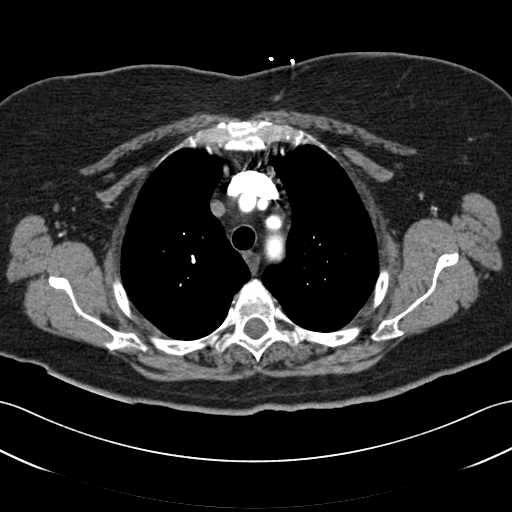
[im 132/153  lung]
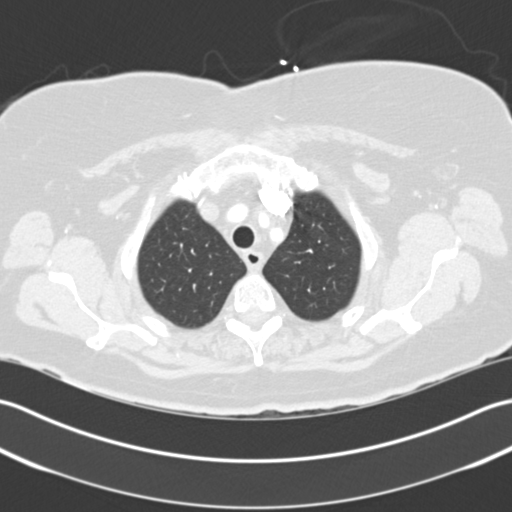
[im 139/153  soft-tissue]
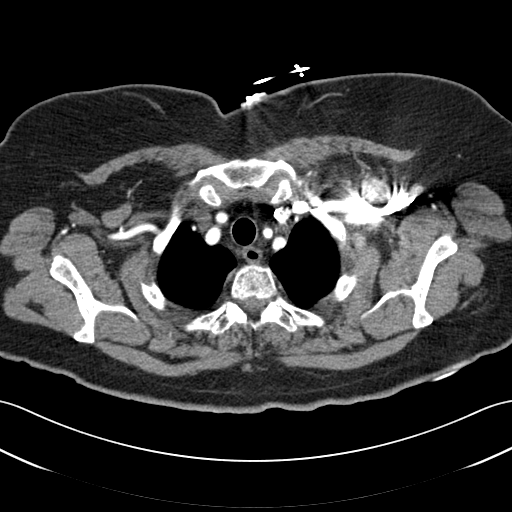
[im 146/153  lung]
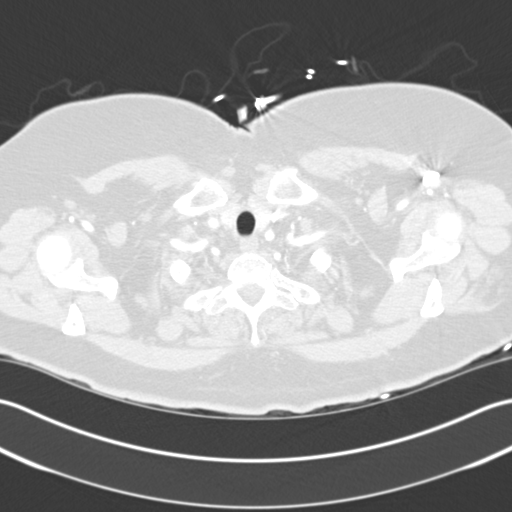

[19 of 32 positions shown; findings below may reference images not displayed]

FINDINGS: There is good opacification of the pulmonary arteries. There is no
pulmonary embolism. The thoracic aorta is normal in caliber and
intact. The lungs are clear. Central airways are patent. There are
no effusions. There is no mediastinal or hilar adenopathy.

In the upper abdomen, there is a small hiatal hernia. There is
cholelithiasis and there is gallbladder mural thickening/edema.

Review of the MIP images confirms the above findings.
IMPRESSION: 1. Thoracic aorta and upper abdominal aorta is normal in caliber
with no evidence of dissection or other acute vascular event.
Pulmonary arterial vasculature is also widely patent.
2. No acute findings are evident in the chest.
3. Small hiatal hernia.
4. Cholelithiasis. Gallbladder wall thickening and edema. Cannot
exclude cholecystitis.
